# Patient Record
Sex: Male | Born: 1975 | Race: White | Hispanic: No | Marital: Single | Smoking: Never smoker
Health system: Southern US, Community
[De-identification: ages and names within clinical notes are randomized; demographics above are authoritative.]

## PROBLEM LIST (undated history)

## (undated) HISTORY — PX: WISDOM TOOTH EXTRACTION: SHX21

## (undated) HISTORY — PX: JOINT REPLACEMENT: SHX530

## (undated) HISTORY — PX: EYE MUSCLE SURGERY: SHX370

---

## 2013-01-13 ENCOUNTER — Encounter (HOSPITAL_BASED_OUTPATIENT_CLINIC_OR_DEPARTMENT_OTHER): Payer: Self-pay | Admitting: Emergency Medicine

## 2013-01-13 ENCOUNTER — Emergency Department (HOSPITAL_BASED_OUTPATIENT_CLINIC_OR_DEPARTMENT_OTHER)
Admission: EM | Admit: 2013-01-13 | Discharge: 2013-01-13 | Disposition: A | Discharge status: Home / Self Care | Payer: BC Managed Care – PPO | Attending: Emergency Medicine | Admitting: Emergency Medicine

## 2013-01-13 ENCOUNTER — Emergency Department (HOSPITAL_BASED_OUTPATIENT_CLINIC_OR_DEPARTMENT_OTHER): Payer: BC Managed Care – PPO

## 2013-01-13 DIAGNOSIS — N201 Calculus of ureter: Secondary | ICD-10-CM

## 2013-01-13 DIAGNOSIS — Z79899 Other long term (current) drug therapy: Secondary | ICD-10-CM | POA: Insufficient documentation

## 2013-01-13 DIAGNOSIS — Z880 Allergy status to penicillin: Secondary | ICD-10-CM | POA: Insufficient documentation

## 2013-01-13 DIAGNOSIS — R1032 Left lower quadrant pain: Secondary | ICD-10-CM | POA: Insufficient documentation

## 2013-01-13 LAB — URINALYSIS, ROUTINE W REFLEX MICROSCOPIC
Bilirubin Urine: NEGATIVE
Glucose, UA: NEGATIVE mg/dL
Ketones, ur: 15 mg/dL — AB
pH: 6 (ref 5.0–8.0)

## 2013-01-13 LAB — CBC WITH DIFFERENTIAL/PLATELET
Basophils Absolute: 0 10*3/uL (ref 0.0–0.1)
Basophils Relative: 1 % (ref 0–1)
Eosinophils Absolute: 0.1 10*3/uL (ref 0.0–0.7)
HCT: 44.7 % (ref 39.0–52.0)
Hemoglobin: 15.8 g/dL (ref 13.0–17.0)
MCH: 32.8 pg (ref 26.0–34.0)
MCHC: 35.3 g/dL (ref 30.0–36.0)
MCV: 92.7 fL (ref 78.0–100.0)
Monocytes Absolute: 0.4 10*3/uL (ref 0.1–1.0)
Monocytes Relative: 10 % (ref 3–12)
Platelets: 144 10*3/uL — ABNORMAL LOW (ref 150–400)
WBC: 4.3 10*3/uL (ref 4.0–10.5)

## 2013-01-13 LAB — BASIC METABOLIC PANEL
BUN: 16 mg/dL (ref 6–23)
Calcium: 10.6 mg/dL — ABNORMAL HIGH (ref 8.4–10.5)
Chloride: 102 mEq/L (ref 96–112)
Creatinine, Ser: 1.2 mg/dL (ref 0.50–1.35)
GFR calc Af Amer: 89 mL/min — ABNORMAL LOW (ref >90)
GFR calc non Af Amer: 76 mL/min — ABNORMAL LOW (ref >90)

## 2013-01-13 LAB — URINE MICROSCOPIC-ADD ON

## 2013-01-13 MED ORDER — MORPHINE SULFATE 4 MG/ML IJ SOLN
8.0000 mg | Freq: Once | INTRAMUSCULAR | Status: AC
  Administered 2013-01-13: 8 mg via INTRAVENOUS
  Filled 2013-01-13: qty 2

## 2013-01-13 MED ORDER — MORPHINE SULFATE 4 MG/ML IJ SOLN
4.0000 mg | Freq: Once | INTRAMUSCULAR | Status: AC
  Administered 2013-01-13: 4 mg via INTRAVENOUS
  Filled 2013-01-13: qty 1

## 2013-01-13 MED ORDER — OXYCODONE-ACETAMINOPHEN 5-325 MG PO TABS: 1.0000 | ORAL_TABLET | ORAL | Status: AC | PRN

## 2013-01-13 MED ORDER — SODIUM CHLORIDE 0.9 % IV BOLUS (SEPSIS)
1000.0000 mL | Freq: Once | INTRAVENOUS | Status: AC
  Administered 2013-01-13: 1000 mL via INTRAVENOUS

## 2013-01-13 MED ORDER — ONDANSETRON HCL 4 MG PO TABS: 4.0000 mg | ORAL_TABLET | Freq: Four times a day (QID) | ORAL | Status: AC | PRN

## 2013-01-13 MED ORDER — ONDANSETRON HCL 4 MG/2ML IJ SOLN
4.0000 mg | Freq: Once | INTRAMUSCULAR | Status: AC
  Administered 2013-01-13: 4 mg via INTRAVENOUS
  Filled 2013-01-13: qty 2

## 2013-01-13 MED ORDER — KETOROLAC TROMETHAMINE 30 MG/ML IJ SOLN
30.0000 mg | Freq: Once | INTRAMUSCULAR | Status: AC
  Administered 2013-01-13: 30 mg via INTRAVENOUS
  Filled 2013-01-13: qty 1

## 2013-01-13 MED ORDER — TAMSULOSIN HCL 0.4 MG PO CAPS: 0.4000 mg | ORAL_CAPSULE | Freq: Every day | ORAL | Status: AC

## 2013-01-13 NOTE — ED Notes (Signed)
Pt sts L posterior flank pain radiating around to LLQ starting 0500 which woke him from sleeping. Denies fever and chills; + nausea, denies vomiting. Denies hx kidney stones.

## 2013-01-13 NOTE — ED Provider Notes (Signed)
CSN: [id number]     Arrival date & time 01/13/13  4098 History   First MD Initiated Contact with Patient 01/13/13 0747     Chief Complaint  Patient presents with  . Flank Pain   (Consider location/radiation/quality/duration/timing/severity/associated sxs/prior Treatment) HPI Comments: 37 year old male presents with 3 hours of left-sided flank pain. States the pain initially started in his back and is since become radiating to his left abdomen. Denies any dysuria or hematuria. Has never had symptoms like this before. He denies a history of kidney stones. The pain is currently severe. The pain is a constant pain of his past 3 hours but does wax and wane in intensity. Nothing seems to make the pain better or worse. Has not tried anything for the pain. He's had nausea but no vomiting.  The history is provided by the patient.    No past medical history on file. Past Surgical History  Procedure Laterality Date  . Joint replacement    . Wisdom tooth extraction    . Eye muscle surgery     No family history on file. History  Substance Use Topics  . Smoking status: Never Smoker   . Smokeless tobacco: Not on file  . Alcohol Use: Yes     Comment: occassionaly    Review of Systems  Constitutional: Negative for fever.  Gastrointestinal: Positive for nausea and abdominal pain. Negative for vomiting.  Genitourinary: Positive for flank pain. Negative for dysuria and hematuria.  All other systems reviewed and are negative.    Allergies  Penicillins  Home Medications   Current Outpatient Rx  Name  Route  Sig  Dispense  Refill  . Multiple Vitamins-Minerals (MULTIVITAMIN WITH MINERALS) tablet   Oral   Take 1 tablet by mouth daily.          BP 139/96  Pulse 54  Temp(Src) 97.7 F (36.5 C) (Oral)  Resp 16  SpO2 100% Physical Exam  Nursing note and vitals reviewed. Constitutional: He is oriented to person, place, and time. He appears well-developed and well-nourished.  HENT:   Head: Normocephalic and atraumatic.  Right Ear: External ear normal.  Left Ear: External ear normal.  Nose: Nose normal.  Eyes: Right eye exhibits no discharge. Left eye exhibits no discharge.  Neck: Neck supple.  Cardiovascular: Normal rate, regular rhythm, normal heart sounds and intact distal pulses.   Pulmonary/Chest: Effort normal.  Abdominal: Soft. There is no tenderness.  Musculoskeletal:       Lumbar back: He exhibits tenderness (left CVA (mild)). He exhibits no bony tenderness.  Neurological: He is alert and oriented to person, place, and time.  Skin: Skin is warm and dry.    ED Course  Procedures (including critical care time) Labs Review Labs Reviewed  URINALYSIS, ROUTINE W REFLEX MICROSCOPIC - Abnormal; Notable for the following:    Color, Urine AMBER (*)    APPearance CLOUDY (*)    Hgb urine dipstick LARGE (*)    Ketones, ur 15 (*)    Leukocytes, UA SMALL (*)    All other components within normal limits  CBC WITH DIFFERENTIAL - Abnormal; Notable for the following:    Platelets 144 (*)    All other components within normal limits  BASIC METABOLIC PANEL - Abnormal; Notable for the following:    Glucose, Bld 118 (*)    Calcium 10.6 (*)    GFR calc non Af Amer 76 (*)    GFR calc Af Amer 89 (*)    All  other components within normal limits  URINE MICROSCOPIC-ADD ON - Abnormal; Notable for the following:    Bacteria, UA MANY (*)    Crystals CA OXALATE CRYSTALS (*)    All other components within normal limits  URINE CULTURE   Imaging Review Ct Abdomen Pelvis Wo Contrast  01/13/2013   CLINICAL DATA:  Left flank pain.  EXAM: CT ABDOMEN AND PELVIS WITHOUT CONTRAST  TECHNIQUE: Multidetector CT imaging of the abdomen and pelvis was performed following the standard protocol without intravenous contrast.  COMPARISON:  None.  FINDINGS: 3.4 mm distal left ureteral obstructing stone located 1.2 cm proximal to the left ureterovesical junction. Moderate left  hydroureteronephrosis. 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  Taking into account limitation by non contrast imaging, no worrisome liver, splenic, pancreatic, renal or adrenal lesion.  No calcified gallstones.  Contracted urinary bladder without gross abnormality.  No extra luminal bowel inflammatory process, free fluid or free air.  No bony destructive lesion.  Degenerative changes L5-S1.  No bowel containing hernia detected.  Lung bases clear.  No abdominal aortic aneurysm.  No worrisome adenopathy.  IMPRESSION: 3.4 mm distal left ureteral obstructing stone located 1.2 cm proximal to the left ureterovesical junction. Moderate left hydroureteronephrosis. 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   Electronically Signed   By: Bridgett Larsson M.D.   On: 01/13/2013 08:47     MDM   1. Ureteral stone    D/w Dr. Marcello Fennel. Will see patient in a couple days. . Recommends not treating bacteria in the urine as he does not have fever or WBC, will send for culture. Pain controlled in ED and no vomiting after getting zofran in ED. Will give flomax, strainer and zofran with percocet. Discussed return precautions with patient.  **After discharge patient's wife called back to say he was vomiting and could not be controlled with zofran. Will call in phenergan suppositories. I recommended they call the urology office or come back to the ER if the phenergan does not work.     Audree Camel, MD 01/13/13 [phone]

## 2013-01-14 LAB — URINE CULTURE
Colony Count: NO GROWTH
Culture: NO GROWTH

## 2019-06-04 MED FILL — LORazepam 0.5 MG TABS: 0.5 | 5 days supply | Qty: 10 | Fill #0

## 2020-02-04 ENCOUNTER — Other Ambulatory Visit (HOSPITAL_BASED_OUTPATIENT_CLINIC_OR_DEPARTMENT_OTHER): Payer: Self-pay | Admitting: Sports Medicine

## 2020-02-04 MED FILL — METHOCARBAMOL 500 MG TABS: 500 | 10 days supply | Qty: 30 | Fill #0

## 2021-01-30 ENCOUNTER — Other Ambulatory Visit (HOSPITAL_BASED_OUTPATIENT_CLINIC_OR_DEPARTMENT_OTHER): Payer: Self-pay

## 2021-03-07 ENCOUNTER — Other Ambulatory Visit (HOSPITAL_BASED_OUTPATIENT_CLINIC_OR_DEPARTMENT_OTHER): Payer: Self-pay | Admitting: Orthopaedic Surgery

## 2021-03-07 DIAGNOSIS — M25521 Pain in right elbow: Secondary | ICD-10-CM

## 2021-03-09 ENCOUNTER — Other Ambulatory Visit: Payer: Self-pay

## 2021-03-09 ENCOUNTER — Ambulatory Visit (HOSPITAL_BASED_OUTPATIENT_CLINIC_OR_DEPARTMENT_OTHER): Payer: BC Managed Care – PPO | Admitting: Orthopaedic Surgery

## 2021-03-09 ENCOUNTER — Ambulatory Visit (HOSPITAL_BASED_OUTPATIENT_CLINIC_OR_DEPARTMENT_OTHER)
Admission: RE | Admit: 2021-03-09 | Discharge: 2021-03-09 | Disposition: A | Discharge status: Home / Self Care | Payer: BC Managed Care – PPO | Source: Ambulatory Visit | Attending: Orthopaedic Surgery | Admitting: Orthopaedic Surgery

## 2021-03-09 DIAGNOSIS — M25821 Other specified joint disorders, right elbow: Secondary | ICD-10-CM

## 2021-03-09 DIAGNOSIS — M25521 Pain in right elbow: Secondary | ICD-10-CM | POA: Diagnosis not present

## 2021-03-09 MED ORDER — LIDOCAINE HCL 1 % IJ SOLN
4.0000 mL | INTRAMUSCULAR | Status: AC | PRN
  Administered 2021-03-09: 4 mL

## 2021-03-09 MED ORDER — TRIAMCINOLONE ACETONIDE 40 MG/ML IJ SUSP
80.0000 mg | INTRAMUSCULAR | Status: AC | PRN
  Administered 2021-03-09: 80 mg via INTRA_ARTICULAR

## 2021-03-09 NOTE — Progress Notes (Signed)
Chief Complaint: right elbow pain     History of Present Illness:    Christian Choi is a 45 y.o. male right-hand-dominant male with right elbow pain after he bumped it on a door in September.  He states he did not have any elbow issues prior to this.  He is the Runner, broadcasting/film/video at [employer].  Since this time the elbow has been sore with particular ranges of motion.  Denies any feelings of instability or giving out.  He has not had any anti-inflammatory medications for this issue.    Surgical History:   None  PMH/PSH/Family History/Social History/Meds/Allergies:   No past medical history on file.  Social History   Socioeconomic History  . Marital status: Single    Spouse name: Not on file  . Number of children: Not on file  . Years of education: Not on file  . Highest education level: Not on file  Occupational History  . Not on file  Tobacco Use  . Smoking status: Never  . Smokeless tobacco: Not on file  Substance and Sexual Activity  . Alcohol use: Yes    Comment: occassionaly  . Drug use: Not on file  . Sexual activity: Not on file  Other Topics Concern  . Not on file  Social History Narrative  . Not on file   Social Determinants of Health   Financial Resource Strain: Not on file  Food Insecurity: Not on file  Transportation Needs: Not on file  Physical Activity: Not on file  Stress: Not on file  Social Connections: Not on file   No family history on file. Allergies  Allergen Reactions  . Penicillins    Current Outpatient Medications  Medication Sig Dispense Refill  . Multiple Vitamins-Minerals (MULTIVITAMIN WITH MINERALS) tablet Take 1 tablet by mouth daily.    . ondansetron (ZOFRAN) 4 MG tablet Take 1 tablet (4 mg total) by mouth every 6 (six) hours as needed for nausea. 12 tablet 0  . oxyCODONE-acetaminophen (PERCOCET) 5-325 MG per tablet Take 1-2 tablets by mouth every 4 (four) hours as needed for pain.  20 tablet 0  . tamsulosin (FLOMAX) 0.4 MG CAPS capsule Take 1 capsule (0.4 mg total) by mouth daily after supper. 30 capsule 0   No current facility-administered medications for this visit.   No results found.  Review of Systems:   A ROS was performed including pertinent positives and negatives as documented in the HPI.  Physical Exam :   Constitutional: NAD and appears stated age Neurological: Alert and oriented Psych: Appropriate affect and cooperative There were no vitals taken for this visit.   Comprehensive Musculoskeletal Exam:    Inspection Right Left  Skin No atrophy or gross abnormalities appreciated No atrophy or gross abnormalities appreciated  Palpation    Tenderness None None  Crepitus None None  Range of Motion    Flexion (passive) 160 160  Flexion (active) 160 160  Extension 0 0  Pronation normal normal  Supination normal normal   Strength    Flexion  5/5 5/5  Extension 5/5 5/5  Pronation  5/5 5/5  Supination  5/5 5/5  Special Tests    Lateral epicondyle pain with resisted wrist extension Negative Negative  Medial epicondyle pain with resisted wrist flexion  Negative Negative  Tinel test over  cubital tunnel  Negative  Negative   Instability    Generalized Laxity No No  Varus stress No instability No instability  Valgus stress  No instability No instability  Reflexes    Triceps Normal (2/4) Normal (2/4)  Brachioradialis Normal (2/4) Normal (2/4)  Biceps Normal (2/4) Normal (2/4)  Neurologic    Fires PIN, radial, median, ulnar, musculocutaneous, axillary, suprascapular, long thoracic, and spinal accessory innervated muscles. No abnormal sensibility.  Vascular/Lymphatic    Radial Pulse 2+ 2+  Cervical Exam    Patient has symmetric cervical range of motion with Negative Spurling's test.   Tenderness to palpation about the olecranon radially  Imaging:   Xray (3 views right elbow): There is an osteophyte about the radial aspect of the lateral   I  personally reviewed and interpreted the radiographs.   Assessment:   45 year old male with right olecranon osteophyte/impingement that is causing pain and inflammation.  Ultimately I believe that hitting with on a door could certainly cause increased inflammation and pain.  At this time I would recommend a right elbow ultrasound-guided injection to help with this.  He would like to proceed.  Plan :    -Plan for right elbow ultrasound-guided injection.    Procedure Note  Patient: Christian Choi             Date of Birth: July 20, 1975           MRN: [id number]             Visit Date: 03/09/2021  Procedures: Visit Diagnoses: No diagnosis found.  Medium Joint Inj on 03/09/2021 5:01 PM Indications: pain Details: 22 G 1.5 in needle, ultrasound-guided lateral approach Medications: 4 mL lidocaine 1 %; 80 mg triamcinolone acetonide 40 MG/ML Outcome: tolerated well, no immediate complications Consent was given by the patient. Immediately prior to procedure a time out was called to verify the correct patient, procedure, equipment, support staff and site/side marked as required. Patient was prepped and draped in the usual sterile fashion.         I personally saw and evaluated the patient, and participated in the management and treatment plan.  Huel Cote, MD Attending Physician, Orthopedic Surgery  This document was dictated using Dragon voice recognition software. A reasonable attempt at proof reading has been made to minimize errors.

## 2022-08-30 ENCOUNTER — Other Ambulatory Visit (HOSPITAL_BASED_OUTPATIENT_CLINIC_OR_DEPARTMENT_OTHER): Payer: Self-pay

## 2022-08-30 MED ORDER — ALPRAZOLAM 0.5 MG PO TABS
0.5000 mg | ORAL_TABLET | Freq: Four times a day (QID) | ORAL | 0 refills | Status: AC
  Filled 2022-08-30: qty 10, 3d supply, fill #0

## 2022-12-09 IMAGING — DX DG ELBOW COMPLETE 3+V*R*
4 series · 4 of 4 positions shown · non-contrast
Comparison: None.

CLINICAL DATA: Right elbow pain

EXAM:
RIGHT ELBOW - COMPLETE 3+ VIEW

[elbow ap]
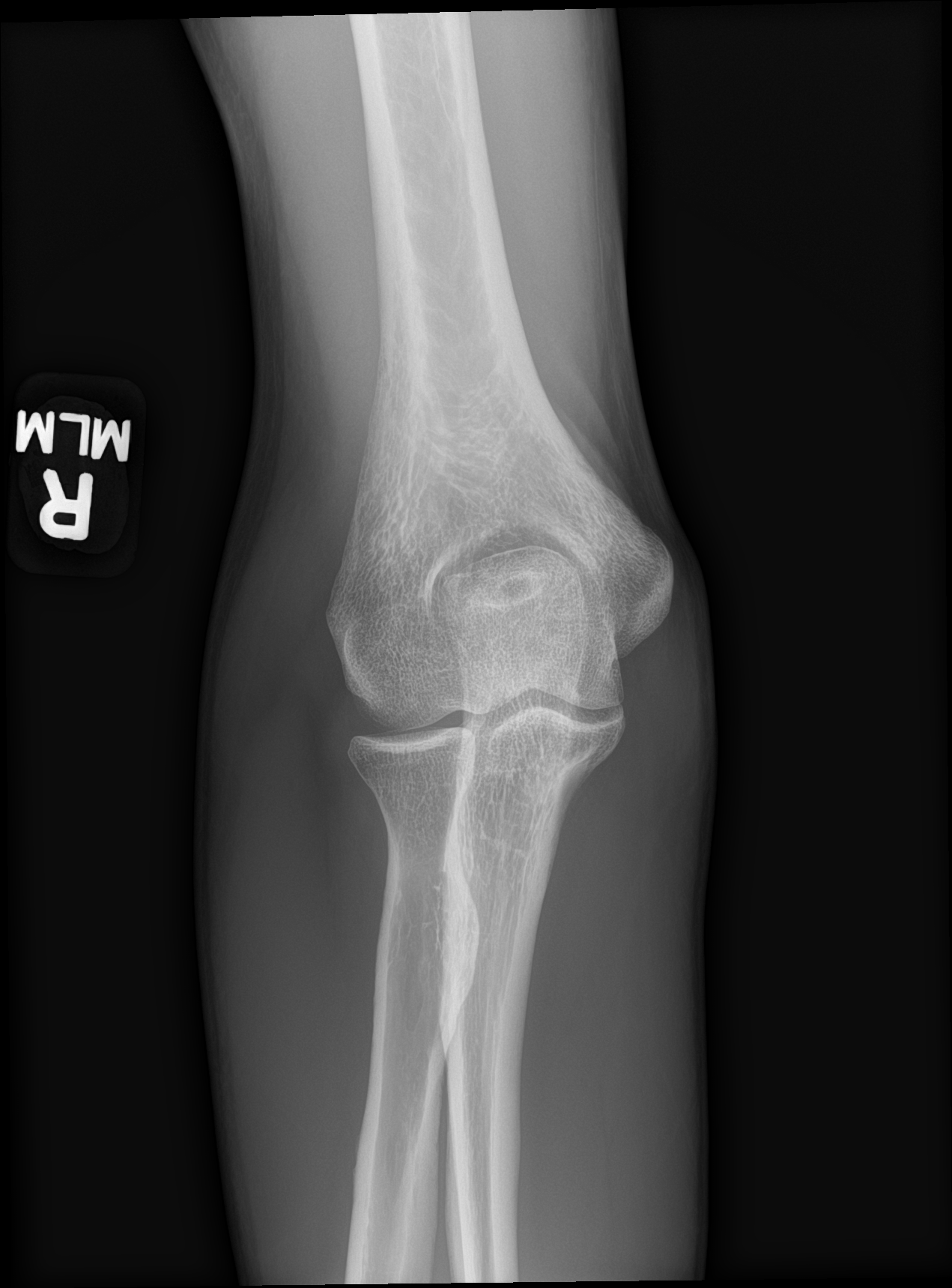

[elbow obl (1 of 2)]
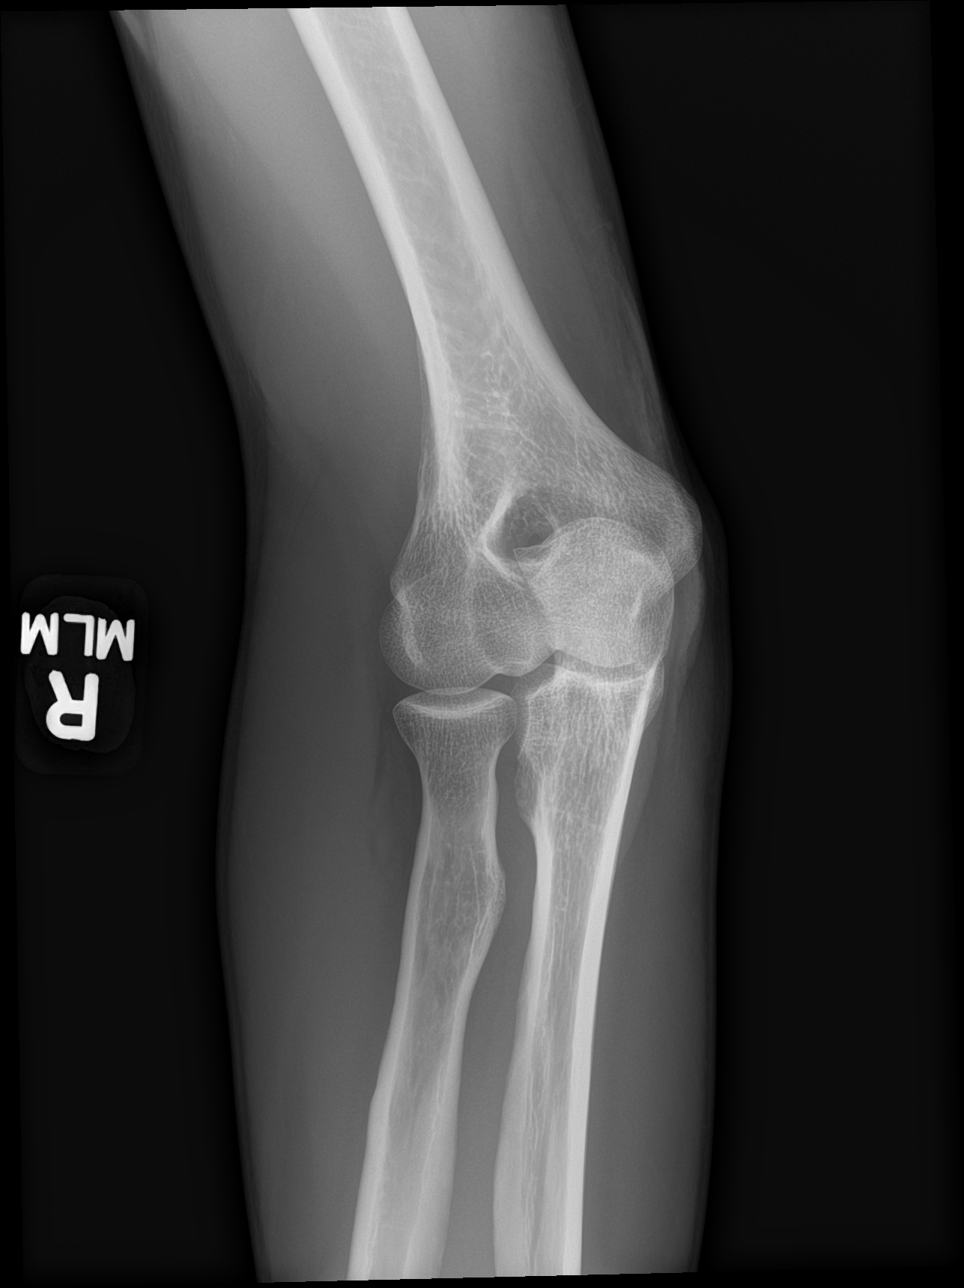

[elbow obl (2 of 2)]
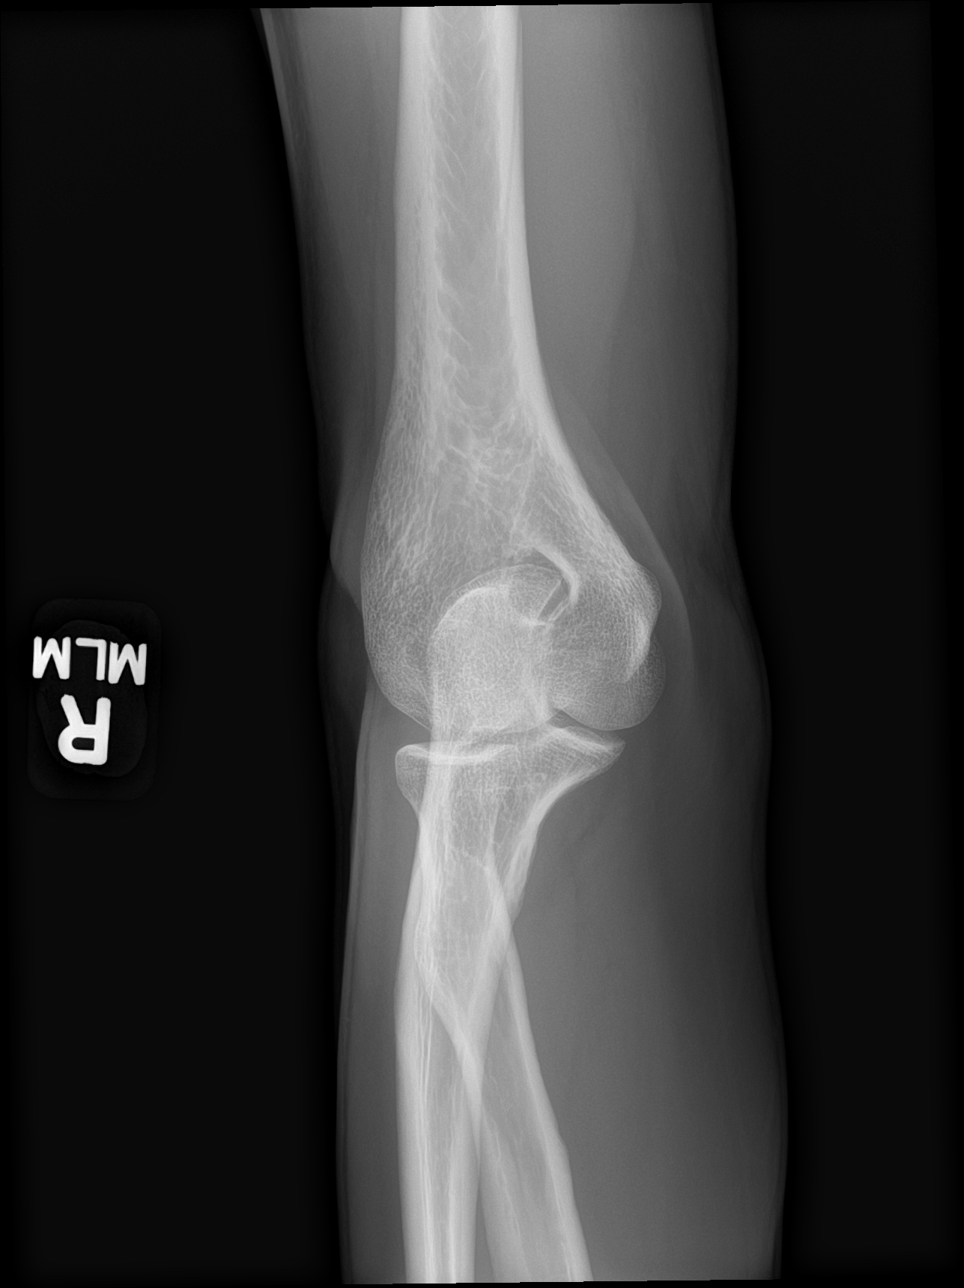

[elbow lat]
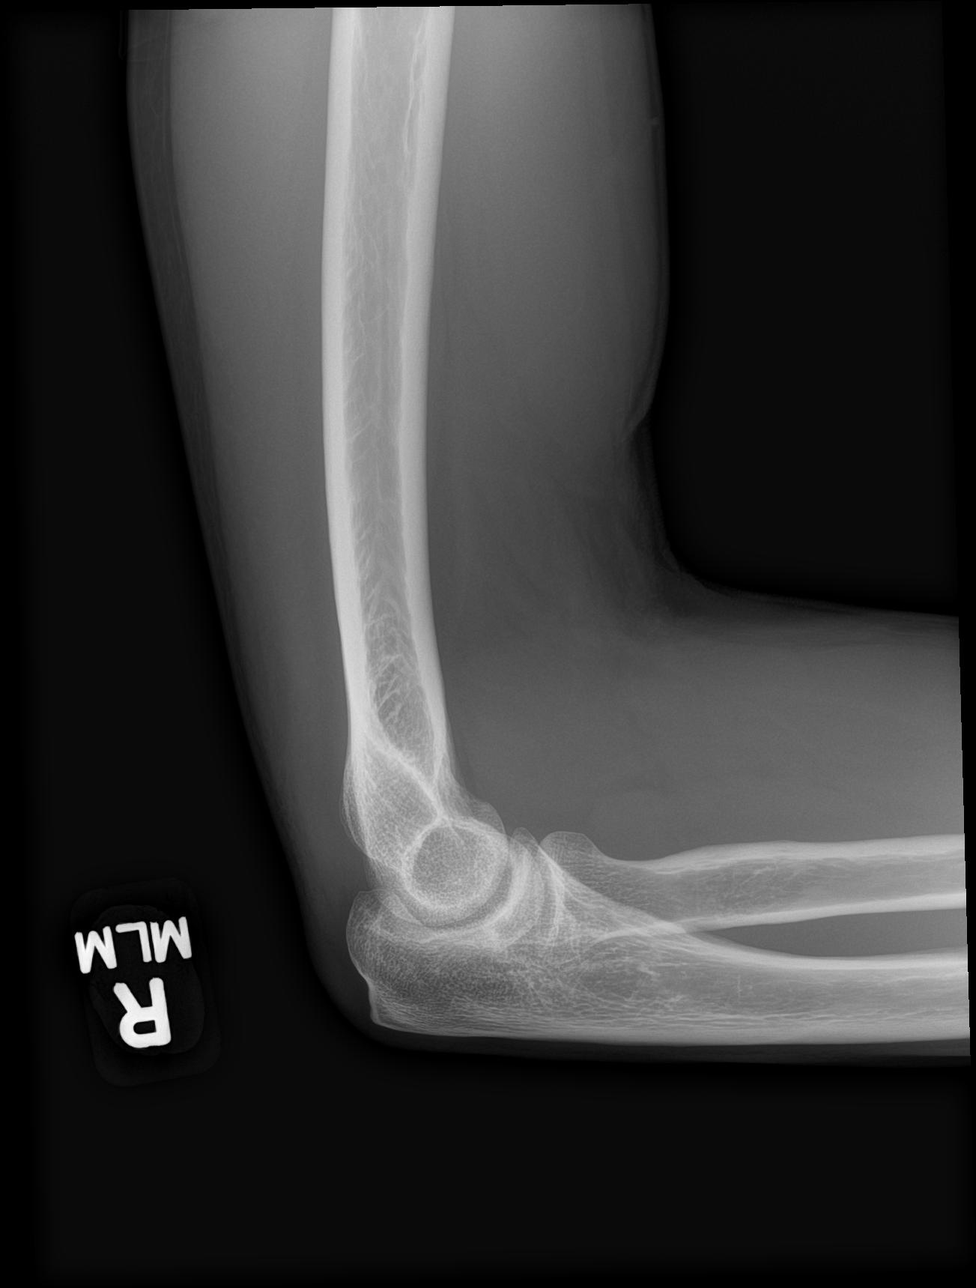

[4 of 4 positions shown; findings below may reference images not displayed]

FINDINGS: There is no evidence of fracture, dislocation, or joint effusion.
There is no evidence of arthropathy or other focal bone abnormality.
Soft tissues are unremarkable.
IMPRESSION: Negative.
